# Patient Record
Sex: Male | Born: 1960
Health system: Southern US, Community
[De-identification: ages and names within clinical notes are randomized; demographics above are authoritative.]

## PROBLEM LIST (undated history)

## (undated) DIAGNOSIS — I671 Cerebral aneurysm, nonruptured: Secondary | ICD-10-CM

## (undated) HISTORY — PX: CERVICAL SPINE SURGERY: SHX589

---

## 2004-03-27 ENCOUNTER — Emergency Department: Payer: Self-pay | Admitting: Internal Medicine

## 2005-03-07 ENCOUNTER — Emergency Department: Payer: Self-pay | Admitting: Emergency Medicine

## 2009-09-06 ENCOUNTER — Emergency Department: Payer: Self-pay

## 2009-09-09 ENCOUNTER — Emergency Department: Payer: Self-pay | Admitting: Emergency Medicine

## 2013-09-30 ENCOUNTER — Emergency Department: Payer: Self-pay | Admitting: Emergency Medicine

## 2018-09-28 ENCOUNTER — Other Ambulatory Visit: Payer: Self-pay

## 2018-09-28 ENCOUNTER — Emergency Department
Admission: EM | Admit: 2018-09-28 | Discharge: 2018-09-29 | Disposition: A | Discharge status: Home / Self Care | Payer: Medicare Other | Attending: Emergency Medicine | Admitting: Emergency Medicine

## 2018-09-28 ENCOUNTER — Encounter: Payer: Self-pay | Admitting: Emergency Medicine

## 2018-09-28 DIAGNOSIS — I10 Essential (primary) hypertension: Secondary | ICD-10-CM | POA: Insufficient documentation

## 2018-09-28 DIAGNOSIS — E876 Hypokalemia: Secondary | ICD-10-CM | POA: Diagnosis not present

## 2018-09-28 DIAGNOSIS — E785 Hyperlipidemia, unspecified: Secondary | ICD-10-CM | POA: Diagnosis not present

## 2018-09-28 DIAGNOSIS — Z87891 Personal history of nicotine dependence: Secondary | ICD-10-CM | POA: Diagnosis not present

## 2018-09-28 DIAGNOSIS — N3001 Acute cystitis with hematuria: Secondary | ICD-10-CM | POA: Diagnosis not present

## 2018-09-28 DIAGNOSIS — R82998 Other abnormal findings in urine: Secondary | ICD-10-CM | POA: Diagnosis present

## 2018-09-28 HISTORY — DX: Cerebral aneurysm, nonruptured: I67.1

## 2018-09-28 LAB — URINALYSIS, COMPLETE (UACMP) WITH MICROSCOPIC
Bilirubin Urine: NEGATIVE
Glucose, UA: NEGATIVE mg/dL
Ketones, ur: NEGATIVE mg/dL
Nitrite: POSITIVE — AB
Protein, ur: 30 mg/dL — AB
Specific Gravity, Urine: 1.017 (ref 1.005–1.030)
Squamous Epithelial / HPF: NONE SEEN (ref 0–5)
WBC, UA: >50 WBC/hpf — ABNORMAL HIGH (ref 0–5)
pH: 5 (ref 5.0–8.0)

## 2018-09-28 LAB — CBC
HCT: 42 % (ref 39.0–52.0)
Hemoglobin: 14.9 g/dL (ref 13.0–17.0)
MCH: 29.2 pg (ref 26.0–34.0)
MCHC: 35.5 g/dL (ref 30.0–36.0)
MCV: 82.4 fL (ref 80.0–100.0)
Platelets: 164 10*3/uL (ref 150–400)
RBC: 5.1 MIL/uL (ref 4.22–5.81)
RDW: 13.3 % (ref 11.5–15.5)
WBC: 7.2 10*3/uL (ref 4.0–10.5)
nRBC: 0 % (ref 0.0–0.2)

## 2018-09-28 LAB — BASIC METABOLIC PANEL
Anion gap: 12 (ref 5–15)
BUN: 25 mg/dL — ABNORMAL HIGH (ref 6–20)
CO2: 28 mmol/L (ref 22–32)
Calcium: 8.7 mg/dL — ABNORMAL LOW (ref 8.9–10.3)
Chloride: 95 mmol/L — ABNORMAL LOW (ref 98–111)
Creatinine, Ser: 1.17 mg/dL (ref 0.61–1.24)
GFR calc Af Amer: >60 mL/min (ref >60)
GFR calc non Af Amer: >60 mL/min (ref >60)
Glucose, Bld: 114 mg/dL — ABNORMAL HIGH (ref 70–99)
Potassium: 2.6 mmol/L — CL (ref 3.5–5.1)
Sodium: 135 mmol/L (ref 135–145)

## 2018-09-28 LAB — CK: Total CK: 109 U/L (ref 49–397)

## 2018-09-28 MED ORDER — POTASSIUM CHLORIDE 10 MEQ/100ML IV SOLN
10.0000 meq | Freq: Once | INTRAVENOUS | Status: AC
  Administered 2018-09-29: 10 meq via INTRAVENOUS
  Filled 2018-09-28: qty 100

## 2018-09-28 MED ORDER — POTASSIUM CHLORIDE CRYS ER 20 MEQ PO TBCR
40.0000 meq | EXTENDED_RELEASE_TABLET | Freq: Once | ORAL | Status: AC
  Administered 2018-09-29: 40 meq via ORAL
  Filled 2018-09-28: qty 2

## 2018-09-28 MED ORDER — SODIUM CHLORIDE 0.9 % IV SOLN
1.0000 g | Freq: Once | INTRAVENOUS | Status: AC
  Administered 2018-09-29: 1 g via INTRAVENOUS
  Filled 2018-09-28: qty 10

## 2018-09-28 NOTE — ED Triage Notes (Signed)
Pt presents to ED with bilateral lower back pain and left lower abd pain that radiates around to his side. Pt states pain has been ongoing for over a week. Pt reports his urine has been "cloudy and dark" in color. No hx of the same. Pt alert and calm at this time. No distress noted.

## 2018-09-28 NOTE — ED Provider Notes (Signed)
[facility] Emergency Department Provider Note  ____________________________________________  Time seen: Approximately 11:50 PM  I have reviewed the triage vital signs and the nursing notes.   HISTORY  Chief Complaint Abdominal Pain and Flank Pain   HPI Corey Parks is a 58 y.o. male with a history of neurofibromatosis, hypertension, hyperlipidemia, nonruptured cerebral aneurysm who presents for evaluation of dark foul smelling urine.  Patient reports a week of dark foul-smelling urine.  Denies ever having a UTI in the past.  He denies urinary frequency or dysuria.  Patient denies abdominal pain.  Reports having bilateral mild low back pain which he attributed to his old mattress.  No pain at this time.  No fever, no nausea, no vomiting, no diarrhea, no constipation, no chest pain or shortness of breath.  Has never had a kidney stone before.   PMH Nonruptured cerebral aneurysm, internal carotid artery 11/05/2014  Hyperlipidemia 01/11/2012  Neurofibromatosis 01/15/2005  Essential hypertension 11/02/2003     Past Surgical History:  Procedure Laterality Date   CERVICAL SPINE SURGERY      Prior to Admission medications   Medication Sig Start Date End Date Taking? Authorizing Provider  atorvastatin (LIPITOR) 40 MG tablet Take 40 mg by mouth daily. 08/24/18  Yes [provider]  hydrochlorothiazide (HYDRODIURIL) 25 MG tablet Take 25 mg by mouth daily. 08/15/18  Yes [provider]  lisinopril (ZESTRIL) 20 MG tablet Take 20 mg by mouth daily. 08/15/18  Yes [provider]  cephALEXin (KEFLEX) 500 MG capsule Take 1 capsule (500 mg total) by mouth 3 (three) times daily for 7 days. 09/29/18 10/06/18  Rudene Re, MD    Allergies Patient has no known allergies.  FH Hypertension Father    Neurofibromatosis Father    Aneurysm Mother    Cancer Son  Died at age 67     Social History Social History   Tobacco Use    Smoking status: Former Smoker   Smokeless tobacco: Never Used  Substance Use Topics   Alcohol use: Not Currently    Frequency: Never   Drug use: Not Currently    Review of Systems  Constitutional: Negative for fever. Eyes: Negative for visual changes. ENT: Negative for sore throat. Neck: No neck pain  Cardiovascular: Negative for chest pain. Respiratory: Negative for shortness of breath. Gastrointestinal: Negative for abdominal pain, vomiting or diarrhea. Genitourinary: Negative for dysuria. + dark, foul smelling urine Musculoskeletal: Negative for back pain. Skin: Negative for rash. Neurological: Negative for headaches, weakness or numbness. Psych: No SI or HI  ____________________________________________   PHYSICAL EXAM:  VITAL SIGNS: ED Triage Vitals  Enc Vitals Group     BP 09/28/18 2031 132/76     Pulse Rate 09/28/18 2031 85     Resp 09/28/18 2031 18     Temp 09/28/18 2031 98.4 F (36.9 C)     Temp Source 09/28/18 2031 Oral     SpO2 09/28/18 2031 98 %     Weight 09/28/18 2036 150 lb (68 kg)     Height 09/28/18 2036 5\' 7"  (1.702 m)     Head Circumference --      Peak Flow --      Pain Score 09/28/18 2036 0     Pain Loc --      Pain Edu? --      Excl. in [place]? --     Constitutional: Alert and oriented. Well appearing and in no apparent distress. HEENT:  Head: Normocephalic and atraumatic.         Eyes: Conjunctivae are normal. Sclera is non-icteric.       Mouth/Throat: Mucous membranes are moist.       Neck: Supple with no signs of meningismus. Cardiovascular: Regular rate and rhythm. No murmurs, gallops, or rubs. 2+ symmetrical distal pulses are present in all extremities. No JVD. Respiratory: Normal respiratory effort. Lungs are clear to auscultation bilaterally. No wheezes, crackles, or rhonchi.  Gastrointestinal: Soft, non tender, and non distended with positive bowel sounds. No rebound or guarding. Genitourinary: No CVA  tenderness. Musculoskeletal: Nontender with normal range of motion in all extremities. No edema, cyanosis, or erythema of extremities. Neurologic: Normal speech and language. Face is symmetric. Moving all extremities. No gross focal neurologic deficits are appreciated. Skin: Skin is warm, dry and intact. No rash noted. Psychiatric: Mood and affect are normal. Speech and behavior are normal.  ____________________________________________   LABS (all labs ordered are listed, but only abnormal results are displayed)  Labs Reviewed  URINALYSIS, COMPLETE (UACMP) WITH MICROSCOPIC - Abnormal; Notable for the following components:      Result Value   Color, Urine YELLOW (*)    APPearance HAZY (*)    Hgb urine dipstick SMALL (*)    Protein, ur 30 (*)    Nitrite POSITIVE (*)    Leukocytes,Ua MODERATE (*)    WBC, UA >50 (*)    Bacteria, UA RARE (*)    All other components within normal limits  BASIC METABOLIC PANEL - Abnormal; Notable for the following components:   Potassium 2.6 (*)    Chloride 95 (*)    Glucose, Bld 114 (*)    BUN 25 (*)    Calcium 8.7 (*)    All other components within normal limits  URINE CULTURE  CBC  CK  MAGNESIUM   ____________________________________________  EKG  ED ECG REPORT I, Nita Sicklearolina Loyd Marhefka, the attending physician, personally viewed and interpreted this ECG.  Normal sinus rhythm, rate of 86, normal intervals, normal axis, left axis deviation, no ST elevations or depressions.  No prior for comparison. ____________________________________________  RADIOLOGY  I have personally reviewed the images performed during this visit and I agree with the Radiologist's read.   Interpretation by Radiologist:  Ct Renal Stone Study  Result Date: 09/29/2018 CLINICAL DATA:  Bilateral flank pain.  Left lower abdominal pain. EXAM: CT ABDOMEN AND PELVIS WITHOUT CONTRAST TECHNIQUE: Multidetector CT imaging of the abdomen and pelvis was performed following the  standard protocol without IV contrast. COMPARISON:  None. FINDINGS: Lower chest: Minimal dependent atelectasis. Lobulated soft tissue masses coursing along central bilateral lower ribs. Hepatobiliary: 13 mm cyst in the left lobe of the liver. Gallbladder physiologically distended, no calcified stone. No biliary dilatation. Pancreas: Pancreas not well-defined in the absence of contrast. No ductal dilatation or inflammation. Spleen: Normal in size without focal abnormality. Adrenals/Urinary Tract: Normal adrenal glands. Prominence of bilateral renal collecting systems without frank hydronephrosis. Mild prominence of the right greater than left ureter. No perinephric edema. No urolithiasis. Urinary bladder elongated displaced anteriorly. No bladder wall thickening or stone. Stomach/Bowel: Bowel evaluation is limited in the absence of enteric contrast. No bowel obstruction or inflammation. Normal appendix. Stomach physiologically distended. Vascular/Lymphatic: Normal caliber abdominal aorta. Multiple well-defined rounded soft tissue masses in the pelvis, largest left lower quadrant measure 2.0 x 2.9 cm, favoring neurofibromas, however adenopathy is not excluded on imaging findings alone. Reproductive: Enlarged heterogeneous prostate gland spanning 5.6 x 5.1 x 5.1 cm (volume =  76 cm^3). Other: Enumerable cutaneous lesions consistent with neurofibromatosis. Additional intramuscular lesions involving the bilateral gluteal musculature. Lesion involving the right anterior 7-8 rib space. Multiple well-defined low-density lesions within the abdomen and pelvis are likely neurofibromas, however difficult to exclude adenopathy. Adjacent to the celiac axis there is a 2.2 cm lesion, image 16 series 2. Largest lesion in the left lower quadrant measures 2.8 x 2.0 cm. Multiple lesions in the lower abdomen and pelvis, primarily extraperitoneal along the iliac vasculature. No free fluid. Musculoskeletal: Bilateral sacral neurofibromas,  left greater than right. No acute osseous abnormalities. No sclerotic bone lesions. IMPRESSION: 1. Mild bilateral renal collecting system and ureteral prominence without stone or cause of obstruction. 2. Constellation of findings consistent with neurofibromatosis with enumerable cutaneous and musculoskeletal soft tissue nodules. Multiple additional soft tissue nodules in the abdomen and pelvis likely also neurofibromas, however difficult to exclude adenopathy on imaging findings alone. 3. Heterogeneous enlarged prostate gland. Recommend correlation with PSA. If elevated, some of these pelvic soft tissue lesions findings may be related to adenopathy rather than neurofibromas. Comparison with prior imaging if available may be helpful. Electronically Signed   By: Narda RutherfordMelanie  Sanford M.D.   On: 09/29/2018 01:16      ____________________________________________   PROCEDURES  Procedure(s) performed: None Procedures Critical Care performed:  None ____________________________________________   INITIAL IMPRESSION / ASSESSMENT AND PLAN / ED COURSE  58 y.o. male with a history of neurofibromatosis, hypertension, hyperlipidemia, nonruptured cerebral aneurysm who presents for evaluation of dark foul smelling urine x 7 days.  Patient is well-appearing in no distress with normal vital signs, no fever, no abdominal pain or tenderness, no flank tenderness.  UA is positive for UTI.  CT renal was done to rule out superinfected kidney stone and it showed no stone.  No signs of sepsis with no fever, no tachycardia, normal white count.  Labs also showing hypokalemia with no EKG changes. K supplemented PO and IV. Recommended f/u with PCP for repeat labs. Discussed my standard return precautions for flank pain, abdominal pain, fever, nausea vomiting.  Patient received a dose of IV Rocephin and will be discharged home with 7-day course of Keflex.       As part of my medical decision making, I reviewed the following data  within the electronic MEDICAL RECORD NUMBER Nursing notes reviewed and incorporated, Labs reviewed , EKG interpreted , Old chart reviewed, Radiograph reviewed , Notes from prior ED visits and  Controlled Substance Database   Patient was evaluated in Emergency Department today for the symptoms described in the history of present illness. Patient was evaluated in the context of the global COVID-19 pandemic, which necessitated consideration that the patient might be at risk for infection with the SARS-CoV-2 virus that causes COVID-19. Institutional protocols and algorithms that pertain to the evaluation of patients at risk for COVID-19 are in a state of rapid change based on information released by regulatory bodies including the CDC and federal and state organizations. These policies and algorithms were followed during the patient's care in the ED.   ____________________________________________   FINAL CLINICAL IMPRESSION(S) / ED DIAGNOSES   Final diagnoses:  Acute cystitis with hematuria  Hypokalemia      NEW MEDICATIONS STARTED DURING THIS VISIT:  ED Discharge Orders         Ordered    cephALEXin (KEFLEX) 500 MG capsule  3 times daily     09/29/18 0216           Note:  This document  was prepared using Conservation officer, historic buildingsDragon voice recognition software and may include unintentional dictation errors.    Nita SickleVeronese, [place], MD 09/29/18 [phone]

## 2018-09-29 ENCOUNTER — Emergency Department: Payer: Medicare Other

## 2018-09-29 LAB — MAGNESIUM: Magnesium: 2.2 mg/dL (ref 1.7–2.4)

## 2018-09-29 MED ORDER — CEPHALEXIN 500 MG PO CAPS: 500.0000 mg | ORAL_CAPSULE | Freq: Three times a day (TID) | ORAL | 0 refills | Status: AC

## 2018-09-29 NOTE — Discharge Instructions (Signed)

## 2018-10-01 LAB — URINE CULTURE: Culture: >=100000 — AB

## 2018-10-28 DIAGNOSIS — Z6823 Body mass index (BMI) 23.0-23.9, adult: Secondary | ICD-10-CM | POA: Diagnosis not present

## 2018-10-28 DIAGNOSIS — N390 Urinary tract infection, site not specified: Secondary | ICD-10-CM | POA: Diagnosis not present

## 2018-10-28 DIAGNOSIS — R300 Dysuria: Secondary | ICD-10-CM | POA: Diagnosis not present

## 2019-05-14 ENCOUNTER — Ambulatory Visit: Payer: Self-pay | Attending: Internal Medicine

## 2019-05-14 DIAGNOSIS — Z23 Encounter for immunization: Secondary | ICD-10-CM | POA: Insufficient documentation

## 2019-05-14 NOTE — Progress Notes (Signed)
   Covid-19 Vaccination Clinic  Name:  Corey Parks    MRN: [id number] DOB: [birth year]  05/14/2019  Corey Parks was observed post Covid-19 immunization for 15 minutes without incident. He was provided with Vaccine Information Sheet and instruction to access the V-Safe system.   Corey Parks was instructed to call 911 with any severe reactions post vaccine: Marland Kitchen Difficulty breathing  . Swelling of face and throat  . A fast heartbeat  . A bad rash all over body  . Dizziness and weakness   Immunizations Administered    Name Date Dose VIS Date Route   Pfizer COVID-19 Vaccine 05/14/2019  4:01 PM 0.3 mL 02/17/2019 Intramuscular   Manufacturer: [employer], [employer]   Lot: [id number]   NDC: [address]-2

## 2019-06-06 ENCOUNTER — Ambulatory Visit: Payer: Self-pay

## 2019-06-07 ENCOUNTER — Ambulatory Visit: Payer: Self-pay | Attending: Internal Medicine

## 2019-06-07 DIAGNOSIS — Z23 Encounter for immunization: Secondary | ICD-10-CM

## 2019-06-07 NOTE — Progress Notes (Signed)
   Covid-19 Vaccination Clinic  Name:  Corey Parks    MRN: [id number] DOB: 06-11-1960  06/07/2019  Mr. Corey Parks was observed post Covid-19 immunization for 15 minutes without incident. He was provided with Vaccine Information Sheet and instruction to access the V-Safe system.   Mr. Corey Parks was instructed to call 911 with any severe reactions post vaccine: Marland Kitchen Difficulty breathing  . Swelling of face and throat  . A fast heartbeat  . A bad rash all over body  . Dizziness and weakness   Immunizations Administered    Name Date Dose VIS Date Route   Pfizer COVID-19 Vaccine 06/07/2019  1:05 PM 0.3 mL 02/17/2019 Intramuscular   Manufacturer: [employer], [employer]   Lot: [id number]   NDC: [address]-0

## 2019-11-11 ENCOUNTER — Ambulatory Visit
Admission: EM | Admit: 2019-11-11 | Discharge: 2019-11-11 | Disposition: A | Discharge status: Home / Self Care | Payer: 59 | Attending: Internal Medicine | Admitting: Internal Medicine

## 2019-11-11 ENCOUNTER — Other Ambulatory Visit: Payer: Self-pay

## 2019-11-11 ENCOUNTER — Encounter: Payer: Self-pay | Admitting: Emergency Medicine

## 2019-11-11 DIAGNOSIS — N3000 Acute cystitis without hematuria: Secondary | ICD-10-CM | POA: Insufficient documentation

## 2019-11-11 DIAGNOSIS — R42 Dizziness and giddiness: Secondary | ICD-10-CM | POA: Insufficient documentation

## 2019-11-11 LAB — COMPREHENSIVE METABOLIC PANEL
ALT: 27 U/L (ref 0–44)
AST: 26 U/L (ref 15–41)
Albumin: 4.1 g/dL (ref 3.5–5.0)
Alkaline Phosphatase: 43 U/L (ref 38–126)
Anion gap: 9 (ref 5–15)
BUN: 19 mg/dL (ref 6–20)
CO2: 27 mmol/L (ref 22–32)
Calcium: 9.2 mg/dL (ref 8.9–10.3)
Chloride: 105 mmol/L (ref 98–111)
Creatinine, Ser: 0.93 mg/dL (ref 0.61–1.24)
GFR calc Af Amer: >60 mL/min (ref >60)
GFR calc non Af Amer: >60 mL/min (ref >60)
Glucose, Bld: 149 mg/dL — ABNORMAL HIGH (ref 70–99)
Potassium: 3.9 mmol/L (ref 3.5–5.1)
Sodium: 141 mmol/L (ref 135–145)
Total Bilirubin: 0.9 mg/dL (ref 0.3–1.2)
Total Protein: 6.8 g/dL (ref 6.5–8.1)

## 2019-11-11 LAB — URINALYSIS, COMPLETE (UACMP) WITH MICROSCOPIC
Bilirubin Urine: NEGATIVE
Glucose, UA: NEGATIVE mg/dL
Hgb urine dipstick: NEGATIVE
Ketones, ur: NEGATIVE mg/dL
Leukocytes,Ua: NEGATIVE
Nitrite: POSITIVE — AB
Protein, ur: NEGATIVE mg/dL
RBC / HPF: NONE SEEN RBC/hpf (ref 0–5)
Specific Gravity, Urine: 1.02 (ref 1.005–1.030)
pH: 7.5 (ref 5.0–8.0)

## 2019-11-11 LAB — CBC WITH DIFFERENTIAL/PLATELET
Abs Immature Granulocytes: 0.01 10*3/uL (ref 0.00–0.07)
Basophils Absolute: 0.1 10*3/uL (ref 0.0–0.1)
Basophils Relative: 1 %
Eosinophils Absolute: 0 10*3/uL (ref 0.0–0.5)
Eosinophils Relative: 1 %
HCT: 45.4 % (ref 39.0–52.0)
Hemoglobin: 15.6 g/dL (ref 13.0–17.0)
Immature Granulocytes: 0 %
Lymphocytes Relative: 18 %
Lymphs Abs: 1.1 10*3/uL (ref 0.7–4.0)
MCH: 29 pg (ref 26.0–34.0)
MCHC: 34.4 g/dL (ref 30.0–36.0)
MCV: 84.4 fL (ref 80.0–100.0)
Monocytes Absolute: 0.3 10*3/uL (ref 0.1–1.0)
Monocytes Relative: 5 %
Neutro Abs: 4.3 10*3/uL (ref 1.7–7.7)
Neutrophils Relative %: 75 %
Platelets: 207 10*3/uL (ref 150–400)
RBC: 5.38 MIL/uL (ref 4.22–5.81)
RDW: 13.4 % (ref 11.5–15.5)
WBC: 5.8 10*3/uL (ref 4.0–10.5)
nRBC: 0 % (ref 0.0–0.2)

## 2019-11-11 MED ORDER — NITROFURANTOIN MONOHYD MACRO 100 MG PO CAPS: 100.0000 mg | ORAL_CAPSULE | Freq: Two times a day (BID) | ORAL | 0 refills | Status: AC

## 2019-11-11 MED ORDER — ONDANSETRON 8 MG PO TBDP
8.0000 mg | ORAL_TABLET | Freq: Once | ORAL | Status: AC
  Administered 2019-11-11: 8 mg via ORAL

## 2019-11-11 MED ORDER — ONDANSETRON 8 MG PO TBDP: 8.0000 mg | ORAL_TABLET | Freq: Three times a day (TID) | ORAL | 0 refills | Status: AC | PRN

## 2019-11-11 MED ORDER — MECLIZINE HCL 25 MG PO TABS: 25.0000 mg | ORAL_TABLET | Freq: Three times a day (TID) | ORAL | 0 refills | Status: AC | PRN

## 2019-11-11 NOTE — Discharge Instructions (Addendum)
Your sugar test is a little elevated and I recommend you have a fasting glucose done with your doctor next week. Your urine shows signs of infection and I will place you on an antibiotic. Take it til gone. Your are not dehydrated.

## 2019-11-11 NOTE — ED Provider Notes (Signed)
[facility]    CSN: [id number] Arrival date & time: 11/11/19  1247      History   Chief Complaint Chief Complaint  Patient presents with  . Dizziness  . Nausea    HPI Corey Parks is a 59 y.o. male ho presents with dizziness described as feeling off balance and nausea which started this am. Denies HA, CP, SOB, abdominal pain. His wife who is with him states he had been working 2 jobs, eating on the go and not resting. Has been drinking plenty of fluids. The dizziness is provoked with any movement. Has not seen his PCP life or had labs in 2 years.     Past Medical History:  Diagnosis Date  . Brain aneurysm     There are no problems to display for this patient.   Past Surgical History:  Procedure Laterality Date  . CERVICAL SPINE SURGERY         Home Medications    Prior to Admission medications   Medication Sig Start Date End Date Taking? Authorizing Provider  atorvastatin (LIPITOR) 40 MG tablet Take 40 mg by mouth daily. 08/24/18  Yes [provider]  hydrochlorothiazide (HYDRODIURIL) 25 MG tablet Take 25 mg by mouth daily. 08/15/18  Yes [provider]  lisinopril (ZESTRIL) 20 MG tablet Take 20 mg by mouth daily. 08/15/18  Yes [provider]    Family History History reviewed. No pertinent family history.  Social History Social History   Tobacco Use  . Smoking status: Former Games developer  . Smokeless tobacco: Never Used  Vaping Use  . Vaping Use: Never used  Substance Use Topics  . Alcohol use: Not Currently  . Drug use: Not Currently     Allergies   Patient has no known allergies.   Review of Systems Review of Systems  Constitutional: Positive for activity change. Negative for appetite change, chills, diaphoresis, fatigue and fever.  HENT: Positive for ear pain. Negative for congestion, ear discharge, postnasal drip, rhinorrhea, sore throat and trouble swallowing.   Eyes: Negative for discharge.  Respiratory:  Negative for cough, chest tightness and shortness of breath.   Cardiovascular: Negative for chest pain, palpitations and leg swelling.  Gastrointestinal: Positive for nausea. Negative for abdominal pain, constipation, diarrhea and vomiting.  Genitourinary: Negative for difficulty urinating and dysuria.       Has noticed dark urine  Neurological: Positive for dizziness. Negative for tremors, syncope, weakness, numbness and headaches.  Hematological: Negative for adenopathy.     Physical Exam Triage Vital Signs ED Triage Vitals  Enc Vitals Group     BP 11/11/19 1309 102/62     Pulse Rate 11/11/19 1309 73     Resp 11/11/19 1309 16     Temp 11/11/19 1309 98.7 F (37.1 C)     Temp Source 11/11/19 1309 Oral     SpO2 11/11/19 1309 100 %     Weight 11/11/19 1306 134 lb (60.8 kg)     Height 11/11/19 1306 5\' 7"  (1.702 m)     Head Circumference --      Peak Flow --      Pain Score 11/11/19 1306 0     Pain Loc --      Pain Edu? --      Excl. in [place]? --    No data found.  Updated Vital Signs BP 102/62 (BP Location: Right Arm)   Pulse 73   Temp 98.7 F (37.1 C) (Oral)   Resp 16  Ht 5\' 7"  (1.702 m)   Wt 134 lb (60.8 kg)   SpO2 100%   BMI 20.99 kg/m   Visual Acuity Right Eye Distance:   Left Eye Distance:   Bilateral Distance:    Right Eye Near:   Left Eye Near:    Bilateral Near:     Physical Exam  Constitutional: he is oriented to person, place, and time, he is slim. No distress who was laying down when I went into the room. HENT:  Head: Normocephalic and atraumatic.  Right Ear: External ear normal.  Left Ear: External ear normal.  Nose: Nose normal.  Eyes: Conjunctivae are normal. Right eye exhibits no discharge. Left eye exhibits no discharge. No scleral icterus.  Neck: Neck supple. No thyromegaly present.  No carotid bruits bilaterally  Cardiovascular: Normal rate and regular rhythm.  No murmur heard. Pulmonary/Chest: Effort normal and breath sounds normal. No  respiratory distress.  Musculoskeletal: Normal range of motion. he exhibits no edema.  Lymphadenopathy: he has no cervical adenopathy.  Neurological: he is alert and oriented to person, place, and time. No face asymmetry or extremity weakness noted.  Skin: Skin is warm and dry. Capillary refill takes less than 2 seconds. No rash noted. he is not diaphoretic.  Psychiatric: he has a normal mood and affect. His behavior is normal. Judgment and thought content normal.  Nursing note reviewed.    UC Treatments / Results  Labs (all labs ordered are listed, but only abnormal results are displayed) Labs Reviewed  URINE CULTURE  CBC WITH DIFFERENTIAL/PLATELET  COMPREHENSIVE METABOLIC PANEL  URINALYSIS, COMPLETE (UACMP) WITH MICROSCOPIC    EKG   Radiology No results found.  Procedures Procedures (including critical care time)  Medications Ordered in UC Medications  ondansetron (ZOFRAN-ODT) disintegrating tablet 8 mg (8 mg Oral Given 11/11/19 1336)    Initial Impression / Assessment and Plan / UC Course  I have reviewed the triage vital signs and the nursing notes. His urine has + nitrates and has a UTI. I sent him urine for a culture. Was placed on Macrobid and Antivert in the mean time. The off balance could be just from the UTI, but since he feels it when he turns over in bed I am concerned he also may have inner ear condition.  Pertinent labs  results that were available during my care of the patient were reviewed by me and considered in my medical decision making (see chart for details).  Final Clinical Impressions(s) / UC Diagnoses   Final diagnoses:  None   Discharge Instructions   None    ED Prescriptions    None     PDMP not reviewed this encounter.   01/11/20, PA-C 11/11/19 1520

## 2019-11-11 NOTE — ED Triage Notes (Signed)
Patient c/o dizziness and nausea that started this morning.  Patient states that if he closes his eyes and lays down his symptoms improve.

## 2019-11-14 LAB — URINE CULTURE
Culture: >=100000 — AB
Special Requests: NORMAL

## 2019-12-14 DIAGNOSIS — I671 Cerebral aneurysm, nonruptured: Secondary | ICD-10-CM | POA: Diagnosis not present

## 2019-12-14 DIAGNOSIS — Q8500 Neurofibromatosis, unspecified: Secondary | ICD-10-CM | POA: Diagnosis not present

## 2019-12-14 DIAGNOSIS — I10 Essential (primary) hypertension: Secondary | ICD-10-CM | POA: Diagnosis not present

## 2019-12-14 DIAGNOSIS — R42 Dizziness and giddiness: Secondary | ICD-10-CM | POA: Diagnosis not present

## 2019-12-14 DIAGNOSIS — Z6821 Body mass index (BMI) 21.0-21.9, adult: Secondary | ICD-10-CM | POA: Diagnosis not present

## 2019-12-14 DIAGNOSIS — E785 Hyperlipidemia, unspecified: Secondary | ICD-10-CM | POA: Diagnosis not present

## 2020-02-26 DIAGNOSIS — G939 Disorder of brain, unspecified: Secondary | ICD-10-CM | POA: Diagnosis not present

## 2020-02-26 DIAGNOSIS — R42 Dizziness and giddiness: Secondary | ICD-10-CM | POA: Diagnosis not present

## 2020-02-26 DIAGNOSIS — Z8679 Personal history of other diseases of the circulatory system: Secondary | ICD-10-CM | POA: Diagnosis not present

## 2020-02-26 DIAGNOSIS — I671 Cerebral aneurysm, nonruptured: Secondary | ICD-10-CM | POA: Diagnosis not present

## 2020-02-26 DIAGNOSIS — Q8500 Neurofibromatosis, unspecified: Secondary | ICD-10-CM | POA: Diagnosis not present

## 2020-04-04 DIAGNOSIS — I10 Essential (primary) hypertension: Secondary | ICD-10-CM | POA: Diagnosis not present

## 2020-04-04 DIAGNOSIS — R42 Dizziness and giddiness: Secondary | ICD-10-CM | POA: Diagnosis not present

## 2020-04-04 DIAGNOSIS — Q8500 Neurofibromatosis, unspecified: Secondary | ICD-10-CM | POA: Diagnosis not present

## 2020-04-04 DIAGNOSIS — Z23 Encounter for immunization: Secondary | ICD-10-CM | POA: Diagnosis not present

## 2020-04-04 DIAGNOSIS — R634 Abnormal weight loss: Secondary | ICD-10-CM | POA: Diagnosis not present

## 2020-04-04 DIAGNOSIS — E785 Hyperlipidemia, unspecified: Secondary | ICD-10-CM | POA: Diagnosis not present

## 2020-04-04 DIAGNOSIS — Z6821 Body mass index (BMI) 21.0-21.9, adult: Secondary | ICD-10-CM | POA: Diagnosis not present

## 2020-04-04 DIAGNOSIS — I671 Cerebral aneurysm, nonruptured: Secondary | ICD-10-CM | POA: Diagnosis not present

## 2020-09-19 DIAGNOSIS — I10 Essential (primary) hypertension: Secondary | ICD-10-CM | POA: Diagnosis not present

## 2020-09-19 DIAGNOSIS — R42 Dizziness and giddiness: Secondary | ICD-10-CM | POA: Diagnosis not present

## 2020-09-19 DIAGNOSIS — Q8500 Neurofibromatosis, unspecified: Secondary | ICD-10-CM | POA: Diagnosis not present

## 2020-09-19 DIAGNOSIS — Z6820 Body mass index (BMI) 20.0-20.9, adult: Secondary | ICD-10-CM | POA: Diagnosis not present

## 2020-09-19 DIAGNOSIS — R5383 Other fatigue: Secondary | ICD-10-CM | POA: Diagnosis not present

## 2020-12-12 DIAGNOSIS — I10 Essential (primary) hypertension: Secondary | ICD-10-CM | POA: Diagnosis not present

## 2020-12-12 DIAGNOSIS — R059 Cough, unspecified: Secondary | ICD-10-CM | POA: Diagnosis not present

## 2020-12-12 DIAGNOSIS — R5383 Other fatigue: Secondary | ICD-10-CM | POA: Diagnosis not present

## 2020-12-12 DIAGNOSIS — B338 Other specified viral diseases: Secondary | ICD-10-CM | POA: Diagnosis not present

## 2020-12-12 DIAGNOSIS — R42 Dizziness and giddiness: Secondary | ICD-10-CM | POA: Diagnosis not present

## 2020-12-12 DIAGNOSIS — J40 Bronchitis, not specified as acute or chronic: Secondary | ICD-10-CM | POA: Diagnosis not present

## 2020-12-12 DIAGNOSIS — Q8501 Neurofibromatosis, type 1: Secondary | ICD-10-CM | POA: Diagnosis not present

## 2021-01-26 IMAGING — CT CT RENAL STONE PROTOCOL
2 of 4 series · 15 of 46 positions shown, 17 images · non-contrast
Comparison: None.

CLINICAL DATA: Bilateral flank pain.  Left lower abdominal pain.

EXAM:
CT ABDOMEN AND PELVIS WITHOUT CONTRAST
TECHNIQUE: Multidetector CT imaging of the abdomen and pelvis was performed
following the standard protocol without IV contrast.

[Series 2: stone full standard · axial · 0.72mm/px · z∈[-1184,-819]mm · 12 of 87 slices shown, 14 images]
[im 7/87  soft-tissue]
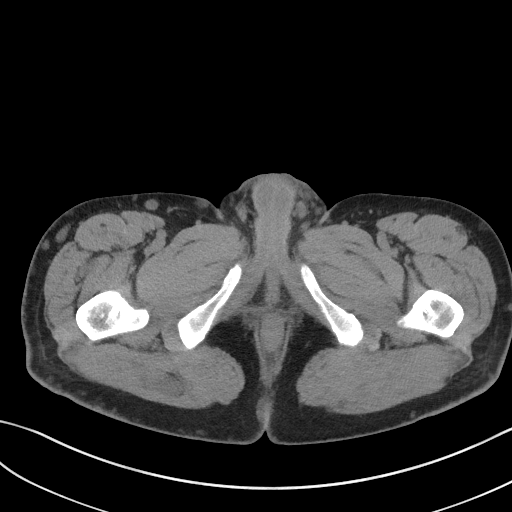
[im 7/87  bone]
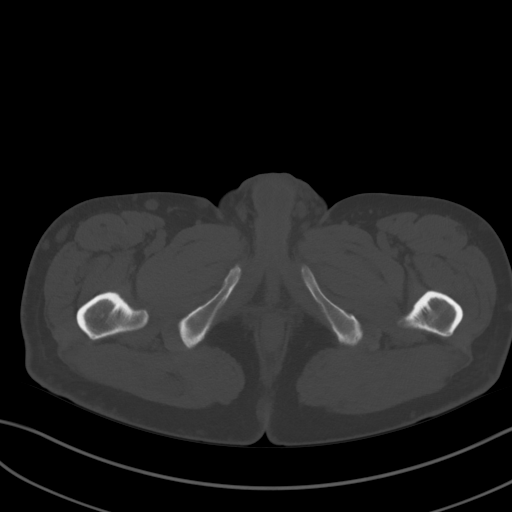
[im 14/87  soft-tissue]
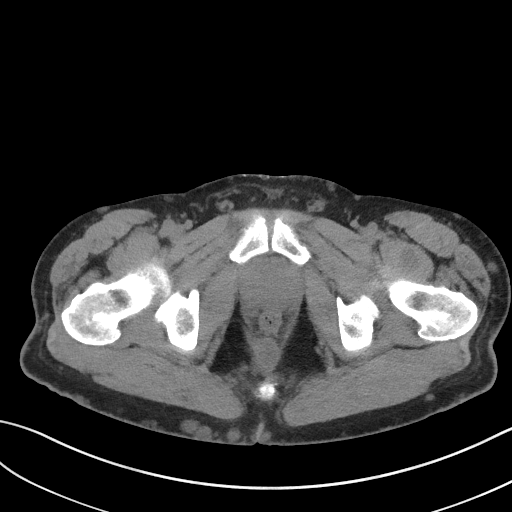
[im 20/87  soft-tissue]
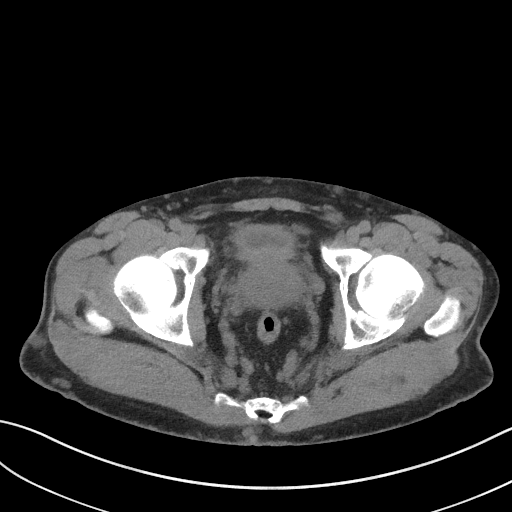
[im 27/87  soft-tissue]
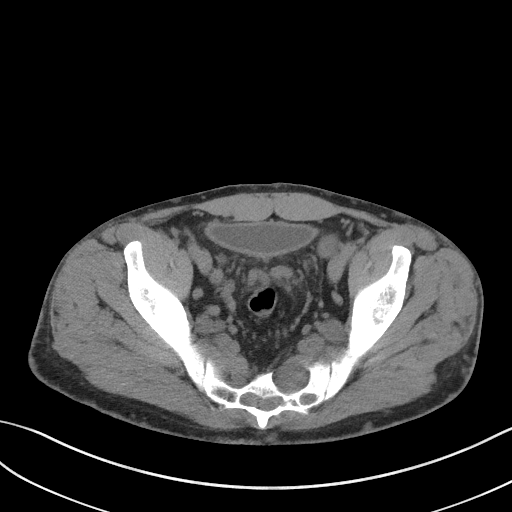
[im 34/87  soft-tissue]
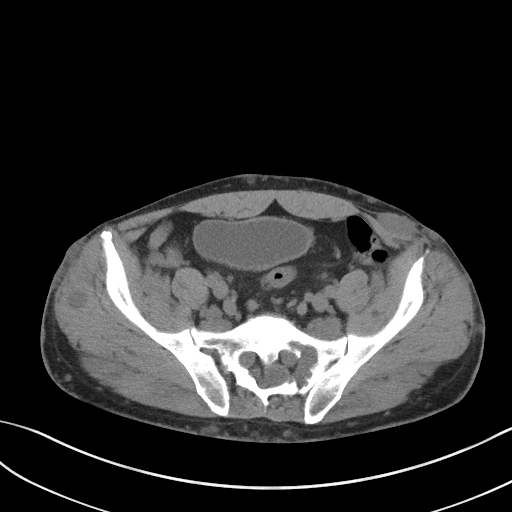
[im 40/87  soft-tissue]
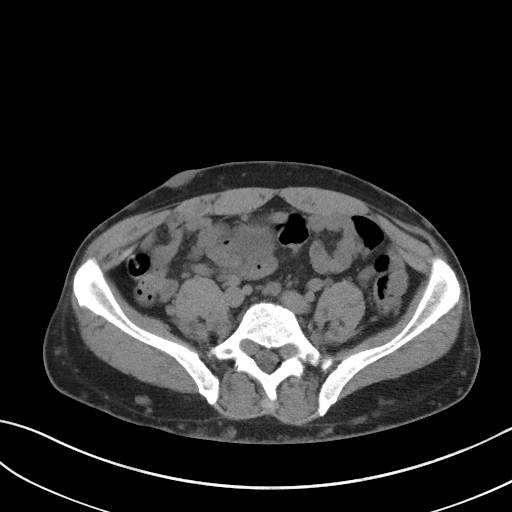
[im 47/87  soft-tissue]
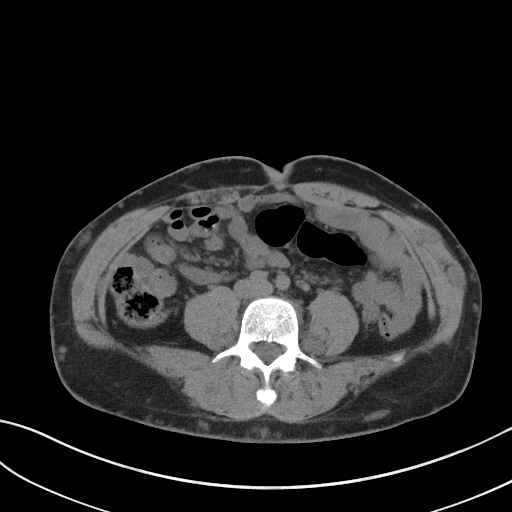
[im 53/87  soft-tissue]
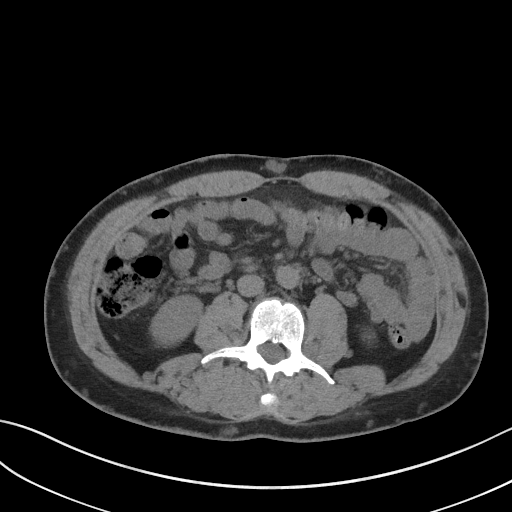
[im 60/87  soft-tissue]
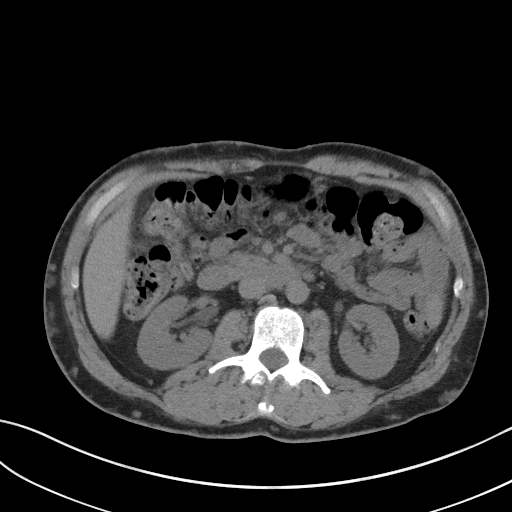
[im 60/87  bone]
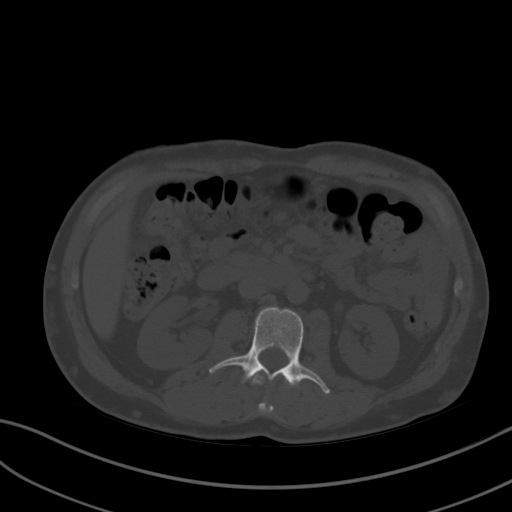
[im 67/87  soft-tissue]
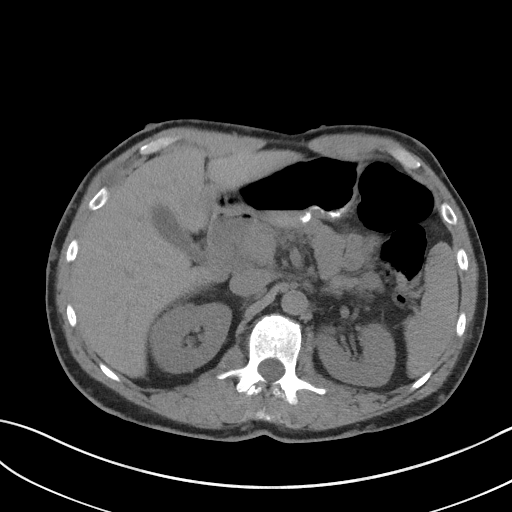
[im 73/87  soft-tissue]
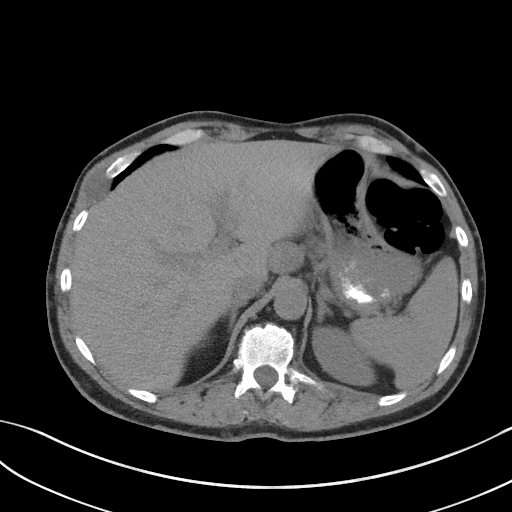
[im 80/87  soft-tissue]
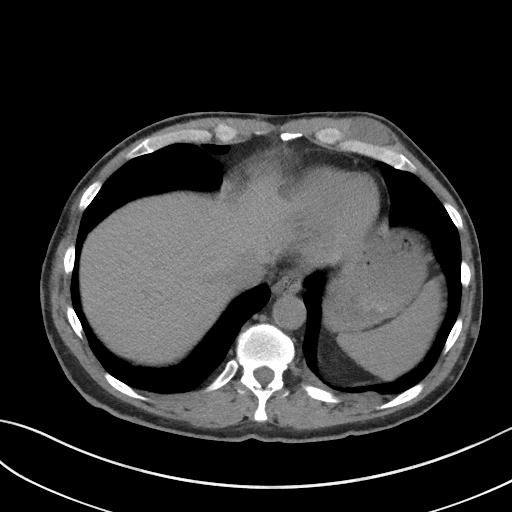

[Series 5: coronal · coronal · 0.71mm/px · 3 of 116 slices shown]
[im 39/116  soft-tissue]
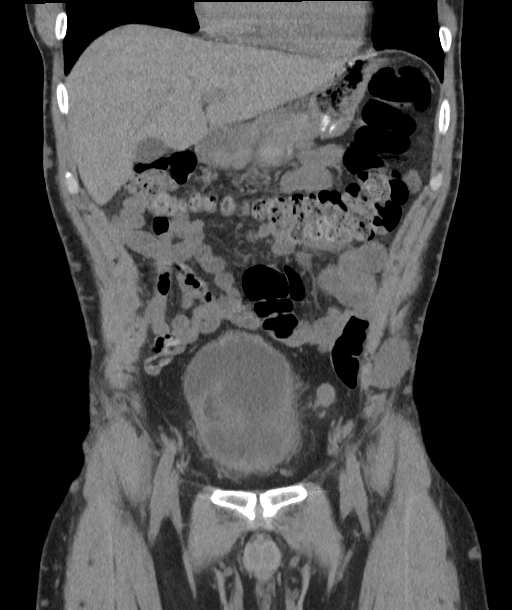
[im 52/116  soft-tissue]
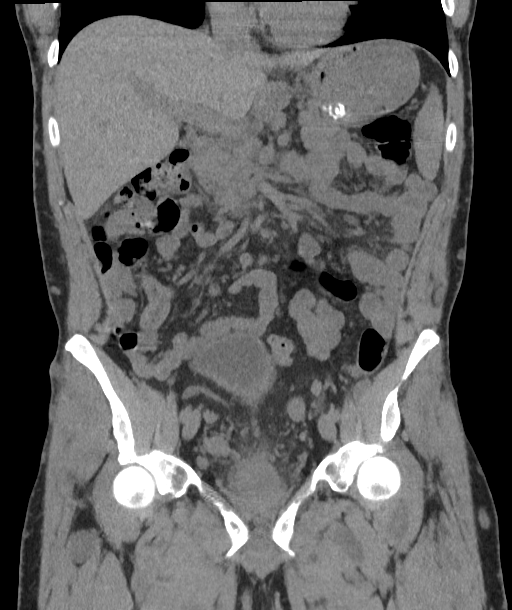
[im 64/116  soft-tissue]
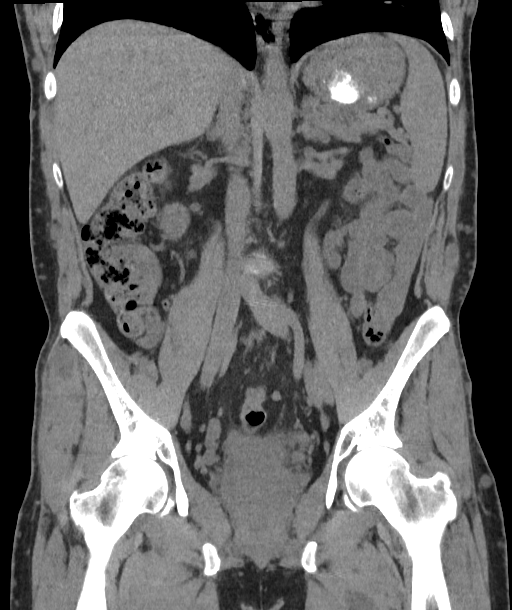

[15 of 46 positions shown; findings below may reference images not displayed]

FINDINGS: Lower chest: Minimal dependent atelectasis. Lobulated soft tissue
masses coursing along central bilateral lower ribs.

Hepatobiliary: 13 mm cyst in the left lobe of the liver. Gallbladder
physiologically distended, no calcified stone. No biliary
dilatation.

Pancreas: Pancreas not well-defined in the absence of contrast. No
ductal dilatation or inflammation.

Spleen: Normal in size without focal abnormality.

Adrenals/Urinary Tract: Normal adrenal glands. Prominence of
bilateral renal collecting systems without frank hydronephrosis.
Mild prominence of the right greater than left ureter. No
perinephric edema. No urolithiasis. Urinary bladder elongated
displaced anteriorly. No bladder wall thickening or stone.

Stomach/Bowel: Bowel evaluation is limited in the absence of enteric
contrast. No bowel obstruction or inflammation. Normal appendix.
Stomach physiologically distended.

Vascular/Lymphatic: Normal caliber abdominal aorta. Multiple
well-defined rounded soft tissue masses in the pelvis, largest left
lower quadrant measure 2.0 x 2.9 cm, favoring neurofibromas, however
adenopathy is not excluded on imaging findings alone.

Reproductive: Enlarged heterogeneous prostate gland spanning 5.6 x
5.1 x 5.1 cm (volume = 76 cm^3).

Other: Enumerable cutaneous lesions consistent with
neurofibromatosis. Additional intramuscular lesions involving the
bilateral gluteal musculature. Lesion involving the right anterior
7-8 rib space. Multiple well-defined low-density lesions within the
abdomen and pelvis are likely neurofibromas, however difficult to
exclude adenopathy. Adjacent to the celiac axis there is a 2.2 cm
lesion, image 16 series 2. Largest lesion in the left lower quadrant
measures 2.8 x 2.0 cm. Multiple lesions in the lower abdomen and
pelvis, primarily extraperitoneal along the iliac vasculature. No
free fluid.

Musculoskeletal: Bilateral sacral neurofibromas, left greater than
right. No acute osseous abnormalities. No sclerotic bone lesions.
IMPRESSION: 1. Mild bilateral renal collecting system and ureteral prominence
without stone or cause of obstruction.
2. Constellation of findings consistent with neurofibromatosis with
enumerable cutaneous and musculoskeletal soft tissue nodules.
Multiple additional soft tissue nodules in the abdomen and pelvis
likely also neurofibromas, however difficult to exclude adenopathy
on imaging findings alone.
3. Heterogeneous enlarged prostate gland. Recommend correlation with
PSA. If elevated, some of these pelvic soft tissue lesions findings
may be related to adenopathy rather than neurofibromas. Comparison
with prior imaging if available may be helpful.

## 2021-04-03 DIAGNOSIS — I10 Essential (primary) hypertension: Secondary | ICD-10-CM | POA: Diagnosis not present

## 2021-04-03 DIAGNOSIS — E785 Hyperlipidemia, unspecified: Secondary | ICD-10-CM | POA: Diagnosis not present

## 2021-04-03 DIAGNOSIS — R42 Dizziness and giddiness: Secondary | ICD-10-CM | POA: Diagnosis not present

## 2021-04-20 DIAGNOSIS — Q8501 Neurofibromatosis, type 1: Secondary | ICD-10-CM | POA: Diagnosis not present

## 2021-07-14 ENCOUNTER — Telehealth: Payer: Self-pay

## 2021-07-14 NOTE — Telephone Encounter (Signed)
Attempted to contact patient because he is listed on our reports for the office, but no record of the patient being seen here. Patient did not answer and no VM set up. Will try to call again.  ? ?OK for [facility] to confirm that patient is not seen here. Also find out if the patient has a PCP and where he goes for primary care.  ?

## 2021-07-17 NOTE — Telephone Encounter (Signed)
Tried calling patient. No answer and VM not set up. Will try to call again tomorrow.  ?

## 2021-07-18 NOTE — Telephone Encounter (Signed)
Called the number in the chart, a lady answered and stated that this was not the correct number.  ?

## 2022-05-29 DIAGNOSIS — R42 Dizziness and giddiness: Secondary | ICD-10-CM | POA: Diagnosis not present

## 2022-05-29 DIAGNOSIS — I10 Essential (primary) hypertension: Secondary | ICD-10-CM | POA: Diagnosis not present

## 2022-05-29 DIAGNOSIS — Q8501 Neurofibromatosis, type 1: Secondary | ICD-10-CM | POA: Diagnosis not present

## 2022-07-19 DIAGNOSIS — M4802 Spinal stenosis, cervical region: Secondary | ICD-10-CM | POA: Diagnosis not present

## 2022-07-19 DIAGNOSIS — M4804 Spinal stenosis, thoracic region: Secondary | ICD-10-CM | POA: Diagnosis not present

## 2022-07-19 DIAGNOSIS — M48061 Spinal stenosis, lumbar region without neurogenic claudication: Secondary | ICD-10-CM | POA: Diagnosis not present

## 2022-07-19 DIAGNOSIS — Q8501 Neurofibromatosis, type 1: Secondary | ICD-10-CM | POA: Diagnosis not present

## 2022-11-18 DIAGNOSIS — Q8501 Neurofibromatosis, type 1: Secondary | ICD-10-CM | POA: Diagnosis not present

## 2022-11-18 DIAGNOSIS — G609 Hereditary and idiopathic neuropathy, unspecified: Secondary | ICD-10-CM | POA: Diagnosis not present
